# Patient Record
Sex: Female | Born: 1975 | Race: White | Hispanic: No | Marital: Single | Smoking: Never smoker
Health system: Southern US, Community
[De-identification: ages and names within clinical notes are randomized; demographics above are authoritative.]

## PROBLEM LIST (undated history)

## (undated) DIAGNOSIS — R55 Syncope and collapse: Secondary | ICD-10-CM

## (undated) DIAGNOSIS — E7800 Pure hypercholesterolemia, unspecified: Secondary | ICD-10-CM

## (undated) DIAGNOSIS — R4681 Obsessive-compulsive behavior: Secondary | ICD-10-CM

## (undated) HISTORY — PX: FOOT SURGERY: SHX648

## (undated) HISTORY — PX: HAND SURGERY: SHX662

## (undated) HISTORY — PX: MOUTH SURGERY: SHX715

---

## 2007-04-12 ENCOUNTER — Ambulatory Visit: Payer: Self-pay | Admitting: Emergency Medicine

## 2008-04-23 ENCOUNTER — Ambulatory Visit: Payer: Self-pay | Admitting: Internal Medicine

## 2009-05-14 ENCOUNTER — Ambulatory Visit: Payer: Self-pay | Admitting: Internal Medicine

## 2012-05-02 ENCOUNTER — Ambulatory Visit: Payer: Self-pay | Admitting: Emergency Medicine

## 2012-05-05 LAB — BETA STREP CULTURE(ARMC)

## 2012-11-09 ENCOUNTER — Ambulatory Visit: Payer: Self-pay | Admitting: Physician Assistant

## 2013-05-26 ENCOUNTER — Ambulatory Visit: Payer: Self-pay | Admitting: Family Medicine

## 2013-06-09 ENCOUNTER — Ambulatory Visit: Payer: Self-pay | Admitting: Family Medicine

## 2013-06-18 ENCOUNTER — Ambulatory Visit: Payer: Self-pay | Admitting: Podiatry

## 2014-01-31 ENCOUNTER — Ambulatory Visit: Payer: Self-pay | Admitting: Physician Assistant

## 2014-01-31 LAB — RAPID INFLUENZA A&B ANTIGENS (ARMC ONLY)

## 2014-01-31 LAB — RAPID STREP-A WITH REFLX: Micro Text Report: NEGATIVE

## 2014-02-03 LAB — BETA STREP CULTURE(ARMC)

## 2014-09-26 ENCOUNTER — Ambulatory Visit
Admission: EM | Admit: 2014-09-26 | Discharge: 2014-09-26 | Disposition: A | Discharge status: Home / Self Care | Payer: Medicare Other | Attending: Family Medicine | Admitting: Family Medicine

## 2014-09-26 DIAGNOSIS — Z809 Family history of malignant neoplasm, unspecified: Secondary | ICD-10-CM | POA: Insufficient documentation

## 2014-09-26 DIAGNOSIS — Z888 Allergy status to other drugs, medicaments and biological substances status: Secondary | ICD-10-CM | POA: Insufficient documentation

## 2014-09-26 DIAGNOSIS — J029 Acute pharyngitis, unspecified: Secondary | ICD-10-CM | POA: Diagnosis present

## 2014-09-26 DIAGNOSIS — E780 Pure hypercholesterolemia: Secondary | ICD-10-CM | POA: Insufficient documentation

## 2014-09-26 DIAGNOSIS — Z882 Allergy status to sulfonamides status: Secondary | ICD-10-CM | POA: Diagnosis not present

## 2014-09-26 DIAGNOSIS — Z833 Family history of diabetes mellitus: Secondary | ICD-10-CM | POA: Insufficient documentation

## 2014-09-26 DIAGNOSIS — J069 Acute upper respiratory infection, unspecified: Secondary | ICD-10-CM | POA: Insufficient documentation

## 2014-09-26 DIAGNOSIS — Z8489 Family history of other specified conditions: Secondary | ICD-10-CM | POA: Insufficient documentation

## 2014-09-26 DIAGNOSIS — Z881 Allergy status to other antibiotic agents status: Secondary | ICD-10-CM | POA: Diagnosis not present

## 2014-09-26 DIAGNOSIS — Z8249 Family history of ischemic heart disease and other diseases of the circulatory system: Secondary | ICD-10-CM | POA: Diagnosis not present

## 2014-09-26 DIAGNOSIS — R5383 Other fatigue: Secondary | ICD-10-CM | POA: Diagnosis present

## 2014-09-26 HISTORY — DX: Obsessive-compulsive behavior: R46.81

## 2014-09-26 HISTORY — DX: Pure hypercholesterolemia, unspecified: E78.00

## 2014-09-26 HISTORY — DX: Syncope and collapse: R55

## 2014-09-26 LAB — URINALYSIS COMPLETE WITH MICROSCOPIC (ARMC ONLY)
BILIRUBIN URINE: NEGATIVE
Glucose, UA: NEGATIVE mg/dL
Hgb urine dipstick: NEGATIVE
KETONES UR: NEGATIVE mg/dL
Leukocytes, UA: NEGATIVE
Nitrite: NEGATIVE
Protein, ur: NEGATIVE mg/dL
SPECIFIC GRAVITY, URINE: 1.02 (ref 1.005–1.030)
pH: 5.5 (ref 5.0–8.0)

## 2014-09-26 NOTE — ED Provider Notes (Signed)
CSN: [id number]     Arrival date & time 09/26/14  1521 History   First MD Initiated Contact with Patient 09/26/14 1610     Chief Complaint  Patient presents with  . URI   (Consider location/radiation/quality/duration/timing/severity/associated sxs/prior Treatment) Patient is a 39 y.o. female presenting with URI.  URI Presenting symptoms: congestion, ear pain, fatigue, rhinorrhea and sore throat   Severity:  Mild Onset quality:  Sudden Timing:  Constant Progression:  Unchanged Chronicity:  New Relieved by:  None tried Worsened by:  Nothing tried Associated symptoms: no arthralgias, no headaches, no myalgias, no neck pain, no sinus pain, no sneezing, no swollen glands and no wheezing     Past Medical History  Diagnosis Date  . Syncope   . Obsessive behaviors   . Hypercholesteremia    Past Surgical History  Procedure Laterality Date  . Hand surgery    . Foot surgery     Family History  Problem Relation Age of Onset  . Diabetes Mother   . Hyperlipidemia Mother   . Cancer Mother   . Diabetes Father   . Hyperlipidemia Father   . Hypertension Brother    History  Substance Use Topics  . Smoking status: Never Smoker   . Smokeless tobacco: Not on file  . Alcohol Use: No   OB History    No data available     Review of Systems  Constitutional: Positive for fatigue.  HENT: Positive for congestion, ear pain, rhinorrhea and sore throat. Negative for sneezing.   Respiratory: Negative for wheezing.   Musculoskeletal: Negative for myalgias, arthralgias and neck pain.  Neurological: Negative for headaches.    Allergies  Niacin and related; Amoxicillin; and Sulfa antibiotics  Home Medications   Prior to Admission medications   Medication Sig Start Date End Date Taking? Authorizing Provider  Multiple Vitamin (MULTIVITAMIN) tablet Take 1 tablet by mouth daily.   Yes Historical Provider, MD   BP 120/54 mmHg  Pulse 73  Temp(Src) 97.6 F (36.4 C) (Tympanic)  Resp 100   Ht 5\' 8"  (1.727 m)  Wt 267 lb (121.11 kg)  BMI 40.61 kg/m2  SpO2 100%  LMP 09/20/2014 (Approximate) Physical Exam  Constitutional: She appears well-developed and well-nourished. No distress.  HENT:  Head: Normocephalic.  Right Ear: Tympanic membrane, external ear and ear canal normal.  Left Ear: Tympanic membrane, external ear and ear canal normal.  Nose: Rhinorrhea present.  Mouth/Throat: Mucous membranes are normal. Posterior oropharyngeal erythema present. No oropharyngeal exudate, posterior oropharyngeal edema or tonsillar abscesses.  Eyes: Conjunctivae and EOM are normal. Pupils are equal, round, and reactive to light. Right eye exhibits no discharge. Left eye exhibits no discharge. No scleral icterus.  Neck: Normal range of motion. Neck supple. No JVD present. No tracheal deviation present. No thyromegaly present.  Cardiovascular: Normal rate, regular rhythm, normal heart sounds and intact distal pulses.   No murmur heard. Pulmonary/Chest: Effort normal and breath sounds normal. No stridor. No respiratory distress. She has no wheezes. She has no rales. She exhibits no tenderness.  Abdominal: Bowel sounds are normal.  Lymphadenopathy:    She has no cervical adenopathy.  Neurological: She is alert.  Skin: No rash noted. She is not diaphoretic.  Vitals reviewed.   ED Course  Procedures (including critical care time) Labs Review Labs Reviewed  URINALYSIS COMPLETEWITH MICROSCOPIC [facility] ONLY) - Abnormal; Notable for the following:    Squamous Epithelial / LPF 0-5 (*)    All other components within normal limits  Imaging Review No results found.   MDM   1. Viral URI    Plan: 1. diagnosis reviewed with patient 2. Recommend supportive treatment with otc analgesics, "cold" medications, rest, increased fluids 3. F/u prn if symptoms worsen or don't improve    Payton Mccallum, MD 09/26/14 1655

## 2014-09-26 NOTE — ED Notes (Signed)
Started yesterday with sore throat and "not feeling well". Temperature 99.9 at work today. Denies any urinary symptoms. Also c/o right ear pain

## 2015-03-28 ENCOUNTER — Ambulatory Visit
Admission: EM | Admit: 2015-03-28 | Discharge: 2015-03-28 | Disposition: A | Discharge status: Home / Self Care | Payer: Medicare Other | Attending: Family Medicine | Admitting: Family Medicine

## 2015-03-28 ENCOUNTER — Encounter: Payer: Self-pay | Admitting: Emergency Medicine

## 2015-03-28 DIAGNOSIS — J069 Acute upper respiratory infection, unspecified: Secondary | ICD-10-CM | POA: Insufficient documentation

## 2015-03-28 DIAGNOSIS — R05 Cough: Secondary | ICD-10-CM | POA: Insufficient documentation

## 2015-03-28 LAB — RAPID STREP SCREEN (MED CTR MEBANE ONLY): Streptococcus, Group A Screen (Direct): NEGATIVE

## 2015-03-28 MED ORDER — FLUTICASONE PROPIONATE 50 MCG/ACT NA SUSP: 2.0000 | Freq: Every day | NASAL | Status: DC

## 2015-03-28 MED ORDER — HYDROCOD POLST-CPM POLST ER 10-8 MG/5ML PO SUER: 5.0000 mL | Freq: Two times a day (BID) | ORAL | Status: DC

## 2015-03-28 NOTE — ED Provider Notes (Signed)
CSN: [id number]     Arrival date & time 03/28/15  1851 History   First MD Initiated Contact with Patient 03/28/15 1916     Chief Complaint  Patient presents with  . 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 Her fevers have been running as high as 99.5. The cough is productive at first of green tinged sputum but has since lessened. Coughing which is interfering with her sleep. 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 Today she is afebrile pulse 88 respirations 16 blood pressure 112/60 and an O2 sat on room air 100%. She does cough frequently during our interview. She also mentions that her throat has been very sore started her symptoms continues to bother her.  Past Medical History  Diagnosis Date  . Syncope   . Obsessive behaviors   . Hypercholesteremia    Past Surgical History  Procedure Laterality Date  . Hand surgery    . Foot surgery     Family History  Problem Relation Age of Onset  . Diabetes Mother   . Hyperlipidemia Mother   . Cancer Mother   . Diabetes Father   . Hyperlipidemia Father   . Hypertension Brother    Social History  Substance Use Topics  . Smoking status: Never Smoker   . Smokeless tobacco: None  . Alcohol Use: No   OB History    Gravida Para Term Preterm AB TAB SAB Ectopic Multiple Living   0 0 0 0 0 0 0 0 0 0      Review of Systems  Constitutional: Positive for fever, chills and activity change. Negative for fatigue.  HENT: Positive for congestion, postnasal drip and sore throat.   Respiratory: Positive for cough. Negative for wheezing and stridor.   All other systems reviewed and are negative.   Allergies  Niacin and related; Amoxicillin; and Sulfa antibiotics  Home Medications   Prior  to Admission medications   Medication Sig Start Date End Date Taking? Authorizing Provider  Black Cohosh 540 MG CAPS Take 1 capsule by mouth daily.   Yes Historical Provider, MD  Evening Primrose topical oil Apply 1 application topically as needed for dry skin.   Yes Historical Provider, MD  chlorpheniramine-HYDROcodone (TUSSIONEX PENNKINETIC ER) 10-8 MG/5ML SUER Take 5 mLs by mouth 2 (two) times daily. 03/28/15   Lutricia FeilWilliam P Roemer, PA-C  fluticasone (FLONASE) 50 MCG/ACT nasal spray Place 2 sprays into both nostrils daily. 03/28/15   Lutricia FeilWilliam P Roemer, PA-C  Multiple Vitamin (MULTIVITAMIN) tablet Take 1 tablet by mouth daily.    Historical Provider, MD   Meds Ordered and Administered this Visit  Medications - No data to display  BP 112/60 mmHg  Pulse 88  Temp(Src) 97.1 F (36.2 C) (Tympanic)  Resp 16  Ht 5\' 8"  (1.727 m)  Wt 260 lb (117.935 kg)  BMI 39.54 kg/m2  SpO2 100%  LMP 03/07/2015 (Approximate) No data found.   Physical Exam  Constitutional: She is oriented to person, place, and time. She appears well-developed and well-nourished. No distress.  HENT:  Head: Normocephalic and atraumatic.  Right Ear: External ear normal.  Left Ear: External ear normal.  Nose: Nose normal.  Mouth/Throat: Oropharynx is clear and moist. No oropharyngeal exudate.  Eyes: Conjunctivae are normal. Pupils are equal, round, and reactive to light.  Neck: Normal range of motion. Neck supple. No thyromegaly present.  Pulmonary/Chest: Effort normal and breath sounds normal. No respiratory distress. She has no wheezes. She has no rales.  Musculoskeletal: Normal range of motion. She exhibits no edema or tenderness.  Lymphadenopathy:    She has no cervical adenopathy.  Neurological: She is alert and oriented to person, place, and time.  Skin: Skin is warm and dry. She is not diaphoretic.  Psychiatric: She has a normal mood and affect. Her behavior is normal. Judgment and thought content normal.  Nursing  note and vitals reviewed.   ED Course  Procedures (including critical care time)  Labs Review Labs Reviewed  RAPID STREP SCREEN (NOT AT [facility])  CULTURE, GROUP A STREP ([facility] ONLY)    Imaging Review No results found.   Visual Acuity Review  Right Eye Distance:   Left Eye Distance:   Bilateral Distance:    Right Eye Near:   Left Eye Near:    Bilateral Near:         MDM   1. Acute URI    Discharge Medication List as of 03/28/2015  8:25 PM    START taking these medications   Details  chlorpheniramine-HYDROcodone (TUSSIONEX PENNKINETIC ER) 10-8 MG/5ML SUER Take 5 mLs by mouth 2 (two) times daily., Starting 03/28/2015, Until Discontinued, Print    fluticasone (FLONASE) 50 MCG/ACT nasal spray Place 2 sprays into both nostrils daily., Starting 03/28/2015, Until Discontinued, Print      Plan: 1. Test/x-ray results and diagnosis reviewed with patient 2. rx as per orders; risks, benefits, potential side effects reviewed with patient 3. Recommend supportive treatment with flonase rest and increased fluids. Keep her out of work today and tomorrow or return on the next day. Discussed with her the URI at this stage does not require antibiotics. 4. F/u with her primary care physician in 1-2 weeks if not improved     Lutricia Feil, PA-C 03/28/15 2028

## 2015-03-28 NOTE — Discharge Instructions (Signed)
Cool Mist Vaporizers °Vaporizers may help relieve the symptoms of a cough and cold. They add moisture to the air, which helps mucus to become thinner and less sticky. This makes it easier to breathe and cough up secretions. Cool mist vaporizers do not cause serious burns like hot mist vaporizers, which may also be called steamers or humidifiers. Vaporizers have not been proven to help with colds. You should not use a vaporizer if you are allergic to mold. °HOME CARE INSTRUCTIONS °· Follow the package instructions for the vaporizer. °· Do not use anything other than distilled water in the vaporizer. °· Do not run the vaporizer all of the time. This can cause mold or bacteria to grow in the vaporizer. °· Clean the vaporizer after each time it is used. °· Clean and dry the vaporizer well before storing it. °· Stop using the vaporizer if worsening respiratory symptoms develop. °  °This information is not intended to replace advice given to you by your health care provider. Make sure you discuss any questions you have with your health care provider. °  °Document Released: 12/21/2003 Document Revised: 03/30/2013 Document Reviewed: 08/12/2012 °Elsevier Interactive Patient Education ©2016 [employer] ° °Upper Respiratory Infection, Adult °Most upper respiratory infections (URIs) are a viral infection of the air passages leading to the lungs. A URI affects the nose, throat, and upper air passages. The most common type of URI is nasopharyngitis and is typically referred to as "the common cold." °URIs run their course and usually go away on their own. Most of the time, a URI does not require medical attention, but sometimes a bacterial infection in the upper airways can follow a viral infection. This is called a secondary infection. Sinus and middle ear infections are common types of secondary upper respiratory infections. °Bacterial pneumonia can also complicate a URI. A URI can worsen asthma and chronic obstructive  pulmonary disease (COPD). Sometimes, these complications can require emergency medical care and may be life threatening.  °CAUSES °Almost all URIs are caused by viruses. A virus is a type of germ and can spread from one person to another.  °RISKS FACTORS °You may be at risk for a URI if:  °· You smoke.   °· You have chronic heart or lung disease. °· You have a weakened defense (immune) system.   °· You are very young or very old.   °· You have nasal allergies or asthma. °· You work in crowded or poorly ventilated areas. °· You work in health care facilities or schools. °SIGNS AND SYMPTOMS  °Symptoms typically develop 2-3 days after you come in contact with a cold virus. Most viral URIs last 7-10 days. However, viral URIs from the influenza virus (flu virus) can last 14-18 days and are typically more severe. Symptoms may include:  °· Runny or stuffy (congested) nose.   °· Sneezing.   °· Cough.   °· Sore throat.   °· Headache.   °· Fatigue.   °· Fever.   °· Loss of appetite.   °· Pain in your forehead, behind your eyes, and over your cheekbones (sinus pain). °· Muscle aches.   °DIAGNOSIS  °Your health care provider may diagnose a URI by: °· Physical exam. °· Tests to check that your symptoms are not due to another condition such as: °¨ Strep throat. °¨ Sinusitis. °¨ Pneumonia. °¨ Asthma. °TREATMENT  °A URI goes away on its own with time. It cannot be cured with medicines, but medicines may be prescribed or recommended to relieve symptoms. Medicines may help: °· Reduce your fever. °· Reduce   your cough. °· Relieve nasal congestion. °HOME CARE INSTRUCTIONS  °· Take medicines only as directed by your health care provider.   °· Gargle warm saltwater or take cough drops to comfort your throat as directed by your health care provider. °· Use a warm mist humidifier or inhale steam from a shower to increase air moisture. This may make it easier to breathe. °· Drink enough fluid to keep your urine clear or pale yellow.   °· Eat  soups and other clear broths and maintain good nutrition.   °· Rest as needed.   °· Return to work when your temperature has returned to normal or as your health care provider advises. You may need to stay home longer to avoid infecting others. You can also use a face mask and careful hand washing to prevent spread of the virus. °· Increase the usage of your inhaler if you have asthma.   °· Do not use any tobacco products, including cigarettes, chewing tobacco, or electronic cigarettes. If you need help quitting, ask your health care provider. °PREVENTION  °The best way to protect yourself from getting a cold is to practice good hygiene.  °· Avoid oral or hand contact with people with cold symptoms.   °· Wash your hands often if contact occurs.   °There is no clear evidence that vitamin C, vitamin E, echinacea, or exercise reduces the chance of developing a cold. However, it is always recommended to get plenty of rest, exercise, and practice good nutrition.  °SEEK MEDICAL CARE IF:  °· You are getting worse rather than better.   °· Your symptoms are not controlled by medicine.   °· You have chills. °· You have worsening shortness of breath. °· You have brown or red mucus. °· You have yellow or brown nasal discharge. °· You have pain in your face, especially when you bend forward. °· You have a fever. °· You have swollen neck glands. °· You have pain while swallowing. °· You have white areas in the back of your throat. °SEEK IMMEDIATE MEDICAL CARE IF:  °· You have severe or persistent: °¨ Headache. °¨ Ear pain. °¨ Sinus pain. °¨ Chest pain. °· You have chronic lung disease and any of the following: °¨ Wheezing. °¨ Prolonged cough. °¨ Coughing up blood. °¨ A change in your usual mucus. °· You have a stiff neck. °· You have changes in your: °¨ Vision. °¨ Hearing. °¨ Thinking. °¨ Mood. °MAKE SURE YOU:  °· Understand these instructions. °· Will watch your condition. °· Will get help right away if you are not doing well or  get worse. °  °This information is not intended to replace advice given to you by your health care provider. Make sure you discuss any questions you have with your health care provider. °  °Document Released: 09/18/2000 Document Revised: 08/09/2014 Document Reviewed: 06/30/2013 °Elsevier Interactive Patient Education ©2016 Elsevier Inc. ° °

## 2015-03-28 NOTE — ED Notes (Signed)
Patient c/o cough and chest congestion since Saturday.   

## 2015-03-31 LAB — CULTURE, GROUP A STREP (THRC)
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
## 2017-01-16 ENCOUNTER — Ambulatory Visit
Admission: EM | Admit: 2017-01-16 | Discharge: 2017-01-16 | Disposition: A | Discharge status: Home / Self Care | Payer: BLUE CROSS/BLUE SHIELD | Attending: Family Medicine | Admitting: Family Medicine

## 2017-01-16 ENCOUNTER — Encounter: Payer: Self-pay | Admitting: Emergency Medicine

## 2017-01-16 DIAGNOSIS — M546 Pain in thoracic spine: Secondary | ICD-10-CM

## 2017-01-16 MED ORDER — NAPROXEN 500 MG PO TABS: 500.0000 mg | ORAL_TABLET | Freq: Two times a day (BID) | ORAL | 0 refills | Status: DC | PRN

## 2017-01-16 MED ORDER — CYCLOBENZAPRINE HCL 10 MG PO TABS
10.0000 mg | ORAL_TABLET | Freq: Three times a day (TID) | ORAL | 0 refills | Status: DC | PRN
Start: 2017-01-16 — End: 2017-05-28
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
## 2017-01-16 NOTE — Discharge Instructions (Signed)
Medications as directed.  Rest.  Take care  Dr. Adriana Simas

## 2017-05-28 ENCOUNTER — Ambulatory Visit
Admission: EM | Admit: 2017-05-28 | Discharge: 2017-05-28 | Disposition: A | Discharge status: Home / Self Care | Payer: BLUE CROSS/BLUE SHIELD | Attending: Emergency Medicine | Admitting: Emergency Medicine

## 2017-05-28 ENCOUNTER — Encounter: Payer: Self-pay | Admitting: *Deleted

## 2017-05-28 ENCOUNTER — Other Ambulatory Visit: Payer: Self-pay

## 2017-05-28 DIAGNOSIS — L240 Irritant contact dermatitis due to detergents: Secondary | ICD-10-CM | POA: Diagnosis not present

## 2017-05-28 DIAGNOSIS — R21 Rash and other nonspecific skin eruption: Secondary | ICD-10-CM | POA: Diagnosis not present

## 2017-05-28 DIAGNOSIS — J22 Unspecified acute lower respiratory infection: Secondary | ICD-10-CM | POA: Diagnosis not present

## 2017-05-28 MED ORDER — TRIAMCINOLONE ACETONIDE 0.1 % EX CREA: 1.0000 "application " | TOPICAL_CREAM | Freq: Two times a day (BID) | CUTANEOUS | 0 refills | Status: AC

## 2017-05-28 MED ORDER — BENZONATATE 200 MG PO CAPS: 200.0000 mg | ORAL_CAPSULE | Freq: Three times a day (TID) | ORAL | 0 refills | Status: DC | PRN

## 2017-05-28 MED ORDER — FLUTICASONE PROPIONATE 50 MCG/ACT NA SUSP: 2.0000 | Freq: Every day | NASAL | 0 refills | Status: DC

## 2017-05-28 MED ORDER — HYDROCOD POLST-CPM POLST ER 10-8 MG/5ML PO SUER: 5.0000 mL | Freq: Two times a day (BID) | ORAL | 0 refills | Status: DC | PRN

## 2017-05-28 MED ORDER — DOXYCYCLINE HYCLATE 100 MG PO CAPS: 100.0000 mg | ORAL_CAPSULE | Freq: Two times a day (BID) | ORAL | 0 refills | Status: AC

## 2017-05-28 NOTE — Discharge Instructions (Signed)
Flonase, Mucinex D 1200/120 mg twice a day saline nasal irrigation, Tessalon, Tussionex.  Will send home with a wait-and-see prescription of doxycycline if you are not better with these medicines in 3 days

## 2017-05-28 NOTE — ED Provider Notes (Signed)
HPI  SUBJECTIVE:  Brenda York is a 42 y.o. female who presents with 2 complaints.  First she reports a greenish nasal congestion, rhinorrhea, postnasal drip, chest congestion,  cough productive in the morning of the same material as her nasal congestion, states that she is coughing all day long. This has been Going on for a week.  Reports mild sore throat from the cough.  Reports body aches, fatigue.  No fevers, wheezing, chest pain, shortness of breath, dyspnea on exertion, double sickening.  States that she is sleeping okay at night.  No antibiotics in the past month.  No antipyretic in the past 6-8 hours.  She tried Elderberry, oscillinuium and NyQuil at night with improvement in her symptoms.  Symptoms are worse with working.  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  No allergy symptoms.   Second, she reports several areas of flaky, dry, erythematous flat rash over her torso for the past 1-2 weeks that started after she started using a new detergent.   She has stopped using this detergent and she states that the rash appears to be getting better. It is not in a dermatomal distribution. She states it is itchy, it is not painful.  No other new lotions, soaps.  She has tried Vaseline lotion which has helped with the dryness, no aggravating factors.  Past medical history negative for asthma, eczema, COPD, smoking, diabetes, hypertension, allergies.  LMP: 1-2 weeks ago.  Denies the possibility being pregnant.  [id number], [facility]     Past Medical History:  Diagnosis Date  . Hypercholesteremia   . Obsessive behaviors   . Syncope     Past Surgical History:  Procedure Laterality Date  . FOOT SURGERY    . HAND SURGERY      Family History  Problem Relation Age of Onset  . Diabetes Mother   . Hyperlipidemia Mother   . Cancer Mother   . Diabetes Father   . Hyperlipidemia Father   . Hypertension Brother     Social History   Tobacco Use  .  Smoking status: Never Smoker  . Smokeless tobacco: Never Used  Substance Use Topics  . Alcohol use: No  . Drug use: No    No current facility-administered medications for this encounter.   Current Outpatient Medications:  .  Black Cohosh 540 MG CAPS, Take 1 capsule by mouth daily., Disp: , Rfl:  .  Evening Primrose topical oil, Apply 1 application topically as needed for dry skin., Disp: , Rfl:  .  Multiple Vitamin (MULTIVITAMIN) tablet, Take 1 tablet by mouth daily., Disp: , Rfl:  .  S-Adenosylmethionine (SAME PO), Take by mouth daily., Disp: , Rfl:  .  benzonatate (TESSALON) 200 MG capsule, Take 1 capsule (200 mg total) by mouth 3 (three) times daily as needed for cough., Disp: 30 capsule, Rfl: 0 .  chlorpheniramine-HYDROcodone (TUSSIONEX PENNKINETIC ER) 10-8 MG/5ML SUER, Take 5 mLs by mouth every 12 (twelve) hours as needed for cough., Disp: 120 mL, Rfl: 0 .  doxycycline (VIBRAMYCIN) 100 MG capsule, Take 1 capsule (100 mg total) by mouth 2 (two) times daily for 7 days., Disp: 14 capsule, Rfl: 0 .  fluticasone (FLONASE) 50 MCG/ACT nasal spray, Place 2 sprays into both nostrils daily., Disp: 16 g, Rfl: 0 .  triamcinolone cream (KENALOG) 0.1 %, Apply 1 application topically 2 (two) times daily. Apply for 2 weeks. May use on face, Disp: 30 g, Rfl: 0  Allergies  Allergen  Reactions  . Niacin And Related Other (See Comments)    Syncope  . Amoxicillin Rash  . Sulfa Antibiotics Rash     ROS  As noted in HPI.   Physical Exam  BP (!) 101/47 (BP Location: Left Arm)   Pulse 67   Temp 98.3 F (36.8 C) (Oral)   Resp 16   Ht 5\' 8"  (1.727 m)   Wt 250 lb (113.4 kg)   LMP 05/14/2017   SpO2 100%   BMI 38.01 kg/m   Constitutional: Well developed, well nourished, no acute distress Eyes:  EOMI, conjunctiva normal bilaterally HENT: Normocephalic, atraumatic,mucus membranes moist.  Clear nasal congestion, erythematous, swollen turbinates, no sinus tenderness.  Positive postnasal drip with  cobblestoning.  Tonsils normal, uvula midline. Respiratory: Normal inspiratory effort, lungs clear bilaterally.  Good air movement. Cardiovascular: Normal rate, regular rhythm, no murmurs, rubs, gallops. GI: nondistended skin: Flat nontender area of erythema underneath the bra strap consistent with contact dermatitis.  See picture.   Musculoskeletal: no deformities Neurologic: Alert & oriented x 3, no focal neuro deficits Psychiatric: Speech and behavior appropriate   ED Course   Medications - No data to display  No orders of the defined types were placed in this encounter.   No results found for this or any previous visit (from the past 24 hour(s)). No results found.  ED Clinical Impression  LRTI (lower respiratory tract infection)  Irritant contact dermatitis due to detergent   ED Assessment/Plan  [place] Narcotic database reviewed for this patient, and feel that the risk/benefit ratio today is favorable for proceeding with a prescription for controlled substance.  No opiate prescriptions in 2 years.  1. Presentation consistent with URI, now transitioning to a lower respiratory tract infection.  She does not describe any symptoms of bronchospasm and her lungs are clear, so withholding albuterol.  Will treat supportively with Flonase, Mucinex D, saline nasal irrigation, Tessalon, Tussionex.  Will send home with a wait-and-see prescription of doxycycline if she is not better with these medicines in 3 days  2.  Rash: Appears to be a contact dermatitis from a new detergent.  Advised patient to switch back to her old detergent. we will send home with triamcinolone.  Follow Up with PMD as needed. Discussed MDM, plan and followup with patient.  patient agrees with plan.   Meds ordered this encounter  Medications  . fluticasone (FLONASE) 50 MCG/ACT nasal spray    Sig: Place 2 sprays into both nostrils daily.    Dispense:  16 g    Refill:  0  . chlorpheniramine-HYDROcodone  (TUSSIONEX PENNKINETIC ER) 10-8 MG/5ML SUER    Sig: Take 5 mLs by mouth every 12 (twelve) hours as needed for cough.    Dispense:  120 mL    Refill:  0  . triamcinolone cream (KENALOG) 0.1 %    Sig: Apply 1 application topically 2 (two) times daily. Apply for 2 weeks. May use on face    Dispense:  30 g    Refill:  0  . benzonatate (TESSALON) 200 MG capsule    Sig: Take 1 capsule (200 mg total) by mouth 3 (three) times daily as needed for cough.    Dispense:  30 capsule    Refill:  0  . doxycycline (VIBRAMYCIN) 100 MG capsule    Sig: Take 1 capsule (100 mg total) by mouth 2 (two) times daily for 7 days.    Dispense:  14 capsule    Refill:  0    *  This clinic note was created using Scientist, clinical (histocompatibility and immunogenetics). Therefore, there may be occasional mistakes despite careful proofreading.   ?   Domenick Gong, MD 05/28/17 1323

## 2017-05-28 NOTE — ED Triage Notes (Signed)
Patient started having symptoms of nasal congestion cough, and aches 1 week ago.

## 2017-09-10 ENCOUNTER — Ambulatory Visit
Admission: EM | Admit: 2017-09-10 | Discharge: 2017-09-10 | Disposition: A | Discharge status: Home / Self Care | Payer: BLUE CROSS/BLUE SHIELD | Attending: Family Medicine | Admitting: Family Medicine

## 2017-09-10 ENCOUNTER — Other Ambulatory Visit: Payer: Self-pay

## 2017-09-10 ENCOUNTER — Encounter: Payer: Self-pay | Admitting: Emergency Medicine

## 2017-09-10 DIAGNOSIS — R5383 Other fatigue: Secondary | ICD-10-CM

## 2017-09-10 DIAGNOSIS — F32A Depression, unspecified: Secondary | ICD-10-CM

## 2017-09-10 DIAGNOSIS — F329 Major depressive disorder, single episode, unspecified: Secondary | ICD-10-CM

## 2017-09-10 LAB — CBC WITH DIFFERENTIAL/PLATELET
BASOS PCT: 1 %
Basophils Absolute: 0 10*3/uL (ref 0–0.1)
EOS ABS: 0 10*3/uL (ref 0–0.7)
EOS PCT: 0 %
HCT: 43.7 % (ref 35.0–47.0)
HEMOGLOBIN: 14.8 g/dL (ref 12.0–16.0)
Lymphocytes Relative: 28 %
Lymphs Abs: 1.6 10*3/uL (ref 1.0–3.6)
MCH: 32.3 pg (ref 26.0–34.0)
MCHC: 33.8 g/dL (ref 32.0–36.0)
MCV: 95.5 fL (ref 80.0–100.0)
Monocytes Absolute: 0.5 10*3/uL (ref 0.2–0.9)
Monocytes Relative: 8 %
NEUTROS PCT: 63 %
Neutro Abs: 3.5 10*3/uL (ref 1.4–6.5)
PLATELETS: 180 10*3/uL (ref 150–440)
RBC: 4.58 MIL/uL (ref 3.80–5.20)
RDW: 12.4 % (ref 11.5–14.5)
WBC: 5.6 10*3/uL (ref 3.6–11.0)

## 2017-09-10 LAB — BASIC METABOLIC PANEL
Anion gap: 8 (ref 5–15)
BUN: 11 mg/dL (ref 6–20)
CALCIUM: 8.5 mg/dL — AB (ref 8.9–10.3)
CO2: 24 mmol/L (ref 22–32)
CREATININE: 0.67 mg/dL (ref 0.44–1.00)
Chloride: 106 mmol/L (ref 101–111)
Glucose, Bld: 93 mg/dL (ref 65–99)
Potassium: 4 mmol/L (ref 3.5–5.1)
SODIUM: 138 mmol/L (ref 135–145)

## 2017-09-10 MED ORDER — BUPROPION HCL ER (XL) 150 MG PO TB24: 150.0000 mg | ORAL_TABLET | Freq: Every day | ORAL | 0 refills | Status: AC

## 2017-09-10 NOTE — ED Provider Notes (Signed)
[facility]    CSN: [id number] Arrival date & time: 09/10/17  1636     History   Chief Complaint Chief Complaint  Patient presents with  . Fatigue  . Depression    HPI Brenda York is a 42 y.o. female.   42 yo female with a c/o fatigue and depression for 4-6 months. Denies suicidal or homicidal ideation. Has not seen PCP for this.   The history is provided by the patient.    Past Medical History:  Diagnosis Date  . Hypercholesteremia   . Obsessive behaviors   . Syncope     There are no active problems to display for this patient.   Past Surgical History:  Procedure Laterality Date  . FOOT SURGERY    . HAND SURGERY    . MOUTH SURGERY      OB History    Gravida  0   Para  0   Term  0   Preterm  0   AB  0   Living  0     SAB  0   TAB  0   Ectopic  0   Multiple  0   Live Births               Home Medications    Prior to Admission medications   Medication Sig Start Date End Date Taking? Authorizing Provider  Black Cohosh 540 MG CAPS Take 1 capsule by mouth daily.   Yes [provider]  Evening Primrose topical oil Apply 1 application topically as needed for dry skin.   Yes [provider]  Multiple Vitamin (MULTIVITAMIN) tablet Take 1 tablet by mouth daily.   Yes [provider]  S-Adenosylmethionine (SAME PO) Take by mouth daily.   Yes [provider]  benzonatate (TESSALON) 200 MG capsule Take 1 capsule (200 mg total) by mouth 3 (three) times daily as needed for cough. 05/28/17   Domenick GongMortenson, Ashley, MD  buPROPion (WELLBUTRIN XL) 150 MG 24 hr tablet Take 1 tablet (150 mg total) by mouth daily. 09/10/17   Payton Mccallumonty, Kiegan Macaraeg, MD  chlorpheniramine-HYDROcodone (TUSSIONEX PENNKINETIC ER) 10-8 MG/5ML SUER Take 5 mLs by mouth every 12 (twelve) hours as needed for cough. 05/28/17   Domenick GongMortenson, Ashley, MD  fluticasone (FLONASE) 50 MCG/ACT nasal spray Place 2 sprays into both nostrils daily. 05/28/17    Domenick GongMortenson, Ashley, MD  triamcinolone cream (KENALOG) 0.1 % Apply 1 application topically 2 (two) times daily. Apply for 2 weeks. May use on face 05/28/17   Domenick GongMortenson, Ashley, MD    Family History Family History  Problem Relation Age of Onset  . Diabetes Mother   . Hyperlipidemia Mother   . Cancer Mother   . Diabetes Father   . Hyperlipidemia Father   . Hypertension Brother     Social History Social History   Tobacco Use  . Smoking status: Never Smoker  . Smokeless tobacco: Never Used  Substance Use Topics  . Alcohol use: No  . Drug use: No     Allergies   Niacin and related; Amoxicillin; and Sulfa antibiotics   Review of Systems Review of Systems   Physical Exam Triage Vital Signs ED Triage Vitals  Enc Vitals Group     BP 09/10/17 1649 (!) 110/54     Pulse Rate 09/10/17 1649 68     Resp 09/10/17 1649 18     Temp 09/10/17 1649 98.8 F (37.1 C)     Temp Source 09/10/17 1649 Oral  SpO2 09/10/17 1649 99 %     Weight 09/10/17 1646 255 lb (115.7 kg)     Height 09/10/17 1646 5\' 8"  (1.727 m)     Head Circumference --      Peak Flow --      Pain Score 09/10/17 1645 0     Pain Loc --      Pain Edu? --      Excl. in GC? --    No data found.  Updated Vital Signs BP (!) 110/54 (BP Location: Left Arm)   Pulse 68   Temp 98.8 F (37.1 C) (Oral)   Resp 18   Ht 5\' 8"  (1.727 m)   Wt 255 lb (115.7 kg)   LMP 08/27/2017 (Approximate)   SpO2 99%   BMI 38.77 kg/m   Visual Acuity Right Eye Distance:   Left Eye Distance:   Bilateral Distance:    Right Eye Near:   Left Eye Near:    Bilateral Near:     Physical Exam  Constitutional: She appears well-developed and well-nourished. No distress.  Neck: No thyromegaly present.  Cardiovascular: Normal rate, regular rhythm and normal heart sounds.  Pulmonary/Chest: Effort normal and breath sounds normal. No stridor. No respiratory distress. She has no wheezes. She has no rales.  Skin: She is not diaphoretic.    Nursing note and vitals reviewed.    UC Treatments / Results  Labs (all labs ordered are listed, but only abnormal results are displayed) Labs Reviewed  BASIC METABOLIC PANEL - Abnormal; Notable for the following components:      Result Value   Calcium 8.5 (*)    All other components within normal limits  CBC WITH DIFFERENTIAL/PLATELET    EKG None  Radiology No results found.  Procedures Procedures (including critical care time)  Medications Ordered in UC Medications - No data to display  Initial Impression / Assessment and Plan / UC Course  I have reviewed the triage vital signs and the nursing notes.  Pertinent labs & imaging results that were available during my care of the patient were reviewed by me and considered in my medical decision making (see chart for details).      Final Clinical Impressions(s) / UC Diagnoses   Final diagnoses:  Depression, unspecified depression type   Discharge Instructions   None    ED Prescriptions    Medication Sig Dispense Auth. Provider   buPROPion (WELLBUTRIN XL) 150 MG 24 hr tablet Take 1 tablet (150 mg total) by mouth daily. 15 tablet Payton Mccallum, MD     1. Lab results and diagnosis reviewed with patient 2. rx as per orders above; reviewed possible side effects, interactions, risks and benefits  3. Follow-up with PCP for continuing management of chronic condition   Controlled Substance Prescriptions [place] Controlled Substance Registry consulted? Not Applicable   Payton Mccallum, MD 09/10/17 [phone]

## 2017-09-10 NOTE — Discharge Instructions (Signed)
Follow up with Primary Care Provider °

## 2017-09-10 NOTE — ED Triage Notes (Signed)
Patient reports fatigue and depression that started 1-2 months ago. Moved out of parents house in January, recent break up with significant other.

## 2018-01-21 ENCOUNTER — Ambulatory Visit
Admission: EM | Admit: 2018-01-21 | Discharge: 2018-01-21 | Disposition: A | Discharge status: Home / Self Care | Payer: BLUE CROSS/BLUE SHIELD | Attending: Family Medicine | Admitting: Family Medicine

## 2018-01-21 ENCOUNTER — Other Ambulatory Visit: Payer: Self-pay

## 2018-01-21 DIAGNOSIS — R05 Cough: Secondary | ICD-10-CM | POA: Diagnosis not present

## 2018-01-21 DIAGNOSIS — J029 Acute pharyngitis, unspecified: Secondary | ICD-10-CM | POA: Diagnosis not present

## 2018-01-21 DIAGNOSIS — J069 Acute upper respiratory infection, unspecified: Secondary | ICD-10-CM

## 2018-01-21 DIAGNOSIS — B9789 Other viral agents as the cause of diseases classified elsewhere: Secondary | ICD-10-CM

## 2018-01-21 DIAGNOSIS — R0981 Nasal congestion: Secondary | ICD-10-CM | POA: Diagnosis not present

## 2018-01-21 LAB — RAPID STREP SCREEN (MED CTR MEBANE ONLY): Streptococcus, Group A Screen (Direct): NEGATIVE

## 2018-01-21 LAB — RAPID INFLUENZA A&B ANTIGENS (ARMC ONLY)
INFLUENZA A (ARMC): NEGATIVE
INFLUENZA B (ARMC): NEGATIVE

## 2018-01-21 NOTE — ED Provider Notes (Signed)
[facility]    CSN: [id number] Arrival date & time: 01/21/18  0844     History   Chief Complaint Chief Complaint  Patient presents with  . Sore Throat    HPI Brenda York is a 42 y.o. female.   The history is provided by the patient.  Sore Throat   URI  Presenting symptoms: congestion, cough, fever and sore throat   Severity:  Moderate Onset quality:  Sudden Duration:  4 days Timing:  Constant Progression:  Unchanged Chronicity:  New Relieved by:  Nothing Ineffective treatments:  OTC medications Associated symptoms: no wheezing   Risk factors: sick contacts   Risk factors: not elderly, no chronic cardiac disease, no chronic kidney disease, no chronic respiratory disease, no diabetes mellitus, no immunosuppression, no recent illness and no recent travel     Past Medical History:  Diagnosis Date  . Hypercholesteremia   . Obsessive behaviors   . Syncope     There are no active problems to display for this patient.   Past Surgical History:  Procedure Laterality Date  . FOOT SURGERY    . HAND SURGERY    . MOUTH SURGERY      OB History    Gravida  0   Para  0   Term  0   Preterm  0   AB  0   Living  0     SAB  0   TAB  0   Ectopic  0   Multiple  0   Live Births               Home Medications    Prior to Admission medications   Medication Sig Start Date End Date Taking? Authorizing Provider  Black Cohosh 540 MG CAPS Take 1 capsule by mouth daily.   Yes [provider]  buPROPion (WELLBUTRIN XL) 150 MG 24 hr tablet Take 1 tablet (150 mg total) by mouth daily. 09/10/17  Yes Payton Mccallum, MD  Evening Primrose topical oil Apply 1 application topically as needed for dry skin.   Yes [provider]  Multiple Vitamin (MULTIVITAMIN) tablet Take 1 tablet by mouth daily.   Yes [provider]  S-Adenosylmethionine (SAME PO) Take by mouth daily.   Yes [provider]  spironolactone  (ALDACTONE) 50 MG tablet Take by mouth. 09/25/17 09/25/18 Yes [provider]  benzonatate (TESSALON) 200 MG capsule Take 1 capsule (200 mg total) by mouth 3 (three) times daily as needed for cough. 05/28/17   Domenick Gong, MD  chlorpheniramine-HYDROcodone [facility] PENNKINETIC ER) 10-8 MG/5ML SUER Take 5 mLs by mouth every 12 (twelve) hours as needed for cough. 05/28/17   Domenick Gong, MD  fluticasone (FLONASE) 50 MCG/ACT nasal spray Place 2 sprays into both nostrils daily. 05/28/17   Domenick Gong, MD  triamcinolone cream (KENALOG) 0.1 % Apply 1 application topically 2 (two) times daily. Apply for 2 weeks. May use on face 05/28/17   Domenick Gong, MD    Family History Family History  Problem Relation Age of Onset  . Diabetes Mother   . Hyperlipidemia Mother   . Cancer Mother   . Diabetes Father   . Hyperlipidemia Father   . Hypertension Brother     Social History Social History   Tobacco Use  . Smoking status: Never Smoker  . Smokeless tobacco: Never Used  Substance Use Topics  . Alcohol use: No  . Drug use: No     Allergies   Niacin  and related; Amoxicillin; and Sulfa antibiotics   Review of Systems Review of Systems  Constitutional: Positive for fever.  HENT: Positive for congestion and sore throat.   Respiratory: Positive for cough. Negative for wheezing.      Physical Exam Triage Vital Signs ED Triage Vitals  Enc Vitals Group     BP 01/21/18 0902 111/66     Pulse Rate 01/21/18 0902 82     Resp 01/21/18 0902 18     Temp 01/21/18 0902 98.5 F (36.9 C)     Temp Source 01/21/18 0902 Oral     SpO2 01/21/18 0902 99 %     Weight 01/21/18 0858 240 lb (108.9 kg)     Height 01/21/18 0858 5\' 8"  (1.727 m)     Head Circumference --      Peak Flow --      Pain Score 01/21/18 0858 4     Pain Loc --      Pain Edu? --      Excl. in GC? --    No data found.  Updated Vital Signs BP 111/66 (BP Location: Right Arm)   Pulse 82   Temp 98.5 F  (36.9 C) (Oral)   Resp 18   Ht 5\' 8"  (1.727 m)   Wt 108.9 kg   LMP 12/31/2017   SpO2 99%   BMI 36.49 kg/m   Visual Acuity Right Eye Distance:   Left Eye Distance:   Bilateral Distance:    Right Eye Near:   Left Eye Near:    Bilateral Near:     Physical Exam  Constitutional: She appears well-developed and well-nourished. No distress.  HENT:  Head: Normocephalic and atraumatic.  Right Ear: Tympanic membrane, external ear and ear canal normal.  Left Ear: Tympanic membrane, external ear and ear canal normal.  Nose: Mucosal edema and rhinorrhea present. No nose lacerations, sinus tenderness, nasal deformity, septal deviation or nasal septal hematoma. No epistaxis.  No foreign bodies. Right sinus exhibits no maxillary sinus tenderness and no frontal sinus tenderness. Left sinus exhibits no maxillary sinus tenderness and no frontal sinus tenderness.  Mouth/Throat: Uvula is midline, oropharynx is clear and moist and mucous membranes are normal. No oropharyngeal exudate.  Neck: Normal range of motion. Neck supple. No thyromegaly present.  Cardiovascular: Normal rate, regular rhythm and normal heart sounds.  Pulmonary/Chest: Effort normal and breath sounds normal. No stridor. No respiratory distress. She has no wheezes. She has no rales.  Lymphadenopathy:    She has no cervical adenopathy.  Skin: She is not diaphoretic.  Nursing note and vitals reviewed.    UC Treatments / Results  Labs (all labs ordered are listed, but only abnormal results are displayed) Labs Reviewed  RAPID STREP SCREEN ([facility] ONLY)  RAPID INFLUENZA A&B ANTIGENS ([facility] ONLY)  CULTURE, GROUP A STREP [facility])    EKG None  Radiology No results found.  Procedures Procedures (including critical care time)  Medications Ordered in UC Medications - No data to display  Initial Impression / Assessment and Plan / UC Course  I have reviewed the triage vital signs and the nursing notes.  Pertinent labs  & imaging results that were available during my care of the patient were reviewed by me and considered in my medical decision making (see chart for details).      Final Clinical Impressions(s) / UC Diagnoses   Final diagnoses:  Viral URI with cough    ED Prescriptions    None  1. Lab results and diagnosis reviewed with patient 2. Recommend supportive treatment with rest, fluids, otc meds prn 3. Follow-up prn if symptoms worsen or don't improve    Controlled Substance Prescriptions [place] Controlled Substance Registry consulted? Not Applicable   Payton Mccallum, MD 01/21/18 1003

## 2018-01-21 NOTE — ED Triage Notes (Signed)
Patient complains of sore throat, low grade fever, and cough since Sunday.

## 2018-01-23 LAB — CULTURE, GROUP A STREP (THRC)

## 2019-10-17 ENCOUNTER — Encounter: Payer: Self-pay | Admitting: Emergency Medicine

## 2019-10-17 ENCOUNTER — Other Ambulatory Visit: Payer: Self-pay

## 2019-10-17 ENCOUNTER — Ambulatory Visit
Admission: EM | Admit: 2019-10-17 | Discharge: 2019-10-17 | Disposition: A | Discharge status: Home / Self Care | Payer: 59 | Attending: Internal Medicine | Admitting: Internal Medicine

## 2019-10-17 DIAGNOSIS — R0981 Nasal congestion: Secondary | ICD-10-CM | POA: Diagnosis not present

## 2019-10-17 DIAGNOSIS — Z20822 Contact with and (suspected) exposure to covid-19: Secondary | ICD-10-CM | POA: Diagnosis not present

## 2019-10-17 DIAGNOSIS — Z880 Allergy status to penicillin: Secondary | ICD-10-CM | POA: Insufficient documentation

## 2019-10-17 DIAGNOSIS — R05 Cough: Secondary | ICD-10-CM | POA: Diagnosis present

## 2019-10-17 DIAGNOSIS — R519 Headache, unspecified: Secondary | ICD-10-CM | POA: Diagnosis not present

## 2019-10-17 DIAGNOSIS — Z79899 Other long term (current) drug therapy: Secondary | ICD-10-CM | POA: Insufficient documentation

## 2019-10-17 DIAGNOSIS — Z882 Allergy status to sulfonamides status: Secondary | ICD-10-CM | POA: Insufficient documentation

## 2019-10-17 DIAGNOSIS — J029 Acute pharyngitis, unspecified: Secondary | ICD-10-CM | POA: Diagnosis not present

## 2019-10-17 MED ORDER — FLUTICASONE PROPIONATE 50 MCG/ACT NA SUSP: 2.0000 | Freq: Every day | NASAL | 0 refills | Status: AC

## 2019-10-17 MED ORDER — BENZONATATE 100 MG PO CAPS: 100.0000 mg | ORAL_CAPSULE | Freq: Three times a day (TID) | ORAL | 0 refills | Status: AC | PRN

## 2019-10-17 NOTE — ED Triage Notes (Signed)
Patient c/o cough, congestion, runny nose, and headaches that started Friday evening.  Patient denies fevers.

## 2019-10-17 NOTE — ED Provider Notes (Signed)
[facility]    CSN: [id number] Arrival date & time: 10/17/19  2122      History   Chief Complaint Chief Complaint  Patient presents with  . Cough  . Nasal Congestion    HPI Brenda York is a 44 y.o. female comes to urgent care with complaints of nasal congestion, runny nose, nonproductive cough and headaches which started Friday.  Patient denies any fever or chills.  Patient works in the hospital on the orthopedic floor and had exposure to somebody without respiratory infection symptoms a few days prior to onset of her symptoms.  She is fully vaccinated against COVID-19 virus.  She denies any shortness of breath or wheezing.  She has some right ear pressure with no ear discharge or pain.  No nausea, vomiting or diarrhea.   HPI  Past Medical History:  Diagnosis Date  . Hypercholesteremia   . Obsessive behaviors   . Syncope     There are no problems to display for this patient.   Past Surgical History:  Procedure Laterality Date  . FOOT SURGERY    . HAND SURGERY    . MOUTH SURGERY      OB History    Gravida  0   Para  0   Term  0   Preterm  0   AB  0   Living  0     SAB  0   TAB  0   Ectopic  0   Multiple  0   Live Births               Home Medications    Prior to Admission medications   Medication Sig Start Date End Date Taking? Authorizing Provider  buPROPion (WELLBUTRIN XL) 150 MG 24 hr tablet Take 1 tablet (150 mg total) by mouth daily. 09/10/17  Yes Payton Mccallum, MD  Evening Primrose topical oil Apply 1 application topically as needed for dry skin.   Yes [provider]  Multiple Vitamin (MULTIVITAMIN) tablet Take 1 tablet by mouth daily.   Yes [provider]  S-Adenosylmethionine (SAME PO) Take by mouth daily.   Yes [provider]  spironolactone (ALDACTONE) 50 MG tablet Take by mouth. 09/25/17 10/17/19 Yes [provider]  benzonatate (TESSALON) 100 MG capsule Take 1 capsule (100  mg total) by mouth 3 (three) times daily as needed for cough. 10/17/19   Merrilee Jansky, MD  fluticasone (FLONASE) 50 MCG/ACT nasal spray Place 2 sprays into both nostrils daily. 10/17/19   Koriana Stepien, Britta Mccreedy, MD  triamcinolone cream (KENALOG) 0.1 % Apply 1 application topically 2 (two) times daily. Apply for 2 weeks. May use on face 05/28/17   Domenick Gong, MD    Family History Family History  Problem Relation Age of Onset  . Diabetes Mother   . Hyperlipidemia Mother   . Cancer Mother   . Diabetes Father   . Hyperlipidemia Father   . Hypertension Brother     Social History Social History   Tobacco Use  . Smoking status: Never Smoker  . Smokeless tobacco: Never Used  Vaping Use  . Vaping Use: Never used  Substance Use Topics  . Alcohol use: No  . Drug use: No     Allergies   Niacin and related, Amoxicillin, and Sulfa antibiotics   Review of Systems Review of Systems  Constitutional: Positive for activity change and fatigue.  HENT: Positive for congestion, postnasal drip, rhinorrhea and sore throat. Negative for mouth sores  and nosebleeds.   Respiratory: Negative.  Negative for cough, shortness of breath and wheezing.   Gastrointestinal: Negative.   Genitourinary: Negative.   Skin: Negative.      Physical Exam Triage Vital Signs ED Triage Vitals  Enc Vitals Group     BP 10/17/19 0926 (!) 111/49     Pulse Rate 10/17/19 0926 91     Resp 10/17/19 0926 14     Temp 10/17/19 0926 98.9 F (37.2 C)     Temp Source 10/17/19 0926 Oral     SpO2 10/17/19 0926 98 %     Weight 10/17/19 0921 234 lb (106.1 kg)     Height 10/17/19 0921 5\' 7"  (1.702 m)     Head Circumference --      Peak Flow --      Pain Score 10/17/19 0921 2     Pain Loc --      Pain Edu? --      Excl. in GC? --    No data found.  Updated Vital Signs BP (!) 111/49 (BP Location: Right Arm)   Pulse 91   Temp 98.9 F (37.2 C) (Oral)   Resp 14   Ht 5\' 7"  (1.702 m)   Wt 106.1 kg   LMP  10/10/2019 (Approximate)   SpO2 98%   BMI 36.65 kg/m   Visual Acuity Right Eye Distance:   Left Eye Distance:   Bilateral Distance:    Right Eye Near:   Left Eye Near:    Bilateral Near:     Physical Exam Vitals and nursing note reviewed.  Constitutional:      General: She is not in acute distress.    Appearance: She is not ill-appearing.  HENT:     Right Ear: Tympanic membrane normal.     Left Ear: Tympanic membrane normal.     Ears:     Comments: Right middle ear effusion    Nose: Congestion present.     Mouth/Throat:     Mouth: Mucous membranes are moist.  Cardiovascular:     Rate and Rhythm: Normal rate and regular rhythm.     Pulses: Normal pulses.     Heart sounds: Normal heart sounds.  Pulmonary:     Effort: Pulmonary effort is normal.     Breath sounds: Normal breath sounds.  Abdominal:     General: Bowel sounds are normal.     Palpations: Abdomen is soft.  Musculoskeletal:        General: Normal range of motion.  Neurological:     Mental Status: She is alert.      UC Treatments / Results  Labs (all labs ordered are listed, but only abnormal results are displayed) Labs Reviewed  SARS CORONAVIRUS 2 (TAT 6-24 HRS)    EKG   Radiology No results found.  Procedures Procedures (including critical care time)  Medications Ordered in UC Medications - No data to display  Initial Impression / Assessment and Plan / UC Course  I have reviewed the triage vital signs and the nursing notes.  Pertinent labs & imaging results that were available during my care of the patient were reviewed by me and considered in my medical decision making (see chart for details).     1.  Acute pharyngitis: Fluticasone nasal spray Tessalon Perles as needed for coughing COVID-19 testing Work note given If symptoms worsen or if you develop worsening shortness of breath, patient is advised to return to urgent care to be reevaluated.  Final  Clinical Impressions(s) / UC  Diagnoses   Final diagnoses:  Acute pharyngitis, unspecified etiology   Discharge Instructions   None    ED Prescriptions    Medication Sig Dispense Auth. Provider   fluticasone (FLONASE) 50 MCG/ACT nasal spray Place 2 sprays into both nostrils daily. 16 g Ivey Nembhard, Britta Mccreedy, MD   benzonatate (TESSALON) 100 MG capsule Take 1 capsule (100 mg total) by mouth 3 (three) times daily as needed for cough. 30 capsule Raneisha Bress, Britta Mccreedy, MD     PDMP not reviewed this encounter.   Merrilee Jansky, MD 10/17/19 [phone]

## 2019-10-18 LAB — SARS CORONAVIRUS 2 (TAT 6-24 HRS): SARS Coronavirus 2: NEGATIVE

## 2023-12-05 ENCOUNTER — Ambulatory Visit
Admission: EM | Admit: 2023-12-05 | Discharge: 2023-12-05 | Disposition: A | Discharge status: Home / Self Care | Attending: Physician Assistant | Admitting: Physician Assistant

## 2023-12-05 DIAGNOSIS — R5383 Other fatigue: Secondary | ICD-10-CM | POA: Diagnosis present

## 2023-12-05 DIAGNOSIS — Z9189 Other specified personal risk factors, not elsewhere classified: Secondary | ICD-10-CM | POA: Diagnosis present

## 2023-12-05 DIAGNOSIS — Z20822 Contact with and (suspected) exposure to covid-19: Secondary | ICD-10-CM | POA: Diagnosis not present

## 2023-12-05 LAB — SARS CORONAVIRUS 2 BY RT PCR: SARS Coronavirus 2 by RT PCR: NEGATIVE

## 2023-12-05 NOTE — ED Triage Notes (Signed)
 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

## 2023-12-05 NOTE — Discharge Instructions (Signed)
Negative COVID test

## 2023-12-05 NOTE — ED Provider Notes (Signed)
 [facility]    CSN: [id number] Arrival date & time: 12/05/23  1736      History   Chief Complaint Chief Complaint  Patient presents with   Covid Exposure    HPI Brenda York is a 48 y.o. female presenting for fatigue and not feeling well for the past few days.  Patient reports having a slight cough a couple days ago that has resolved.  No fever, sore throat, congestion, chest pain, shortness of breath, abdominal pain, nausea/vomiting or diarrhea.  Reports COVID exposure.  Has taken COVID tests but they have been negative.  She would like to go back to work and will need a note to return.  States she is feeling better than she was a few days ago.  HPI  Past Medical History:  Diagnosis Date   Hypercholesteremia    Obsessive behaviors    Syncope     There are no active problems to display for this patient.   Past Surgical History:  Procedure Laterality Date   FOOT SURGERY     HAND SURGERY     MOUTH SURGERY      OB History     Gravida  0   Para  0   Term  0   Preterm  0   AB  0   Living  0      SAB  0   IAB  0   Ectopic  0   Multiple  0   Live Births               Home Medications    Prior to Admission medications   Medication Sig Start Date End Date Taking? Authorizing Provider  buPROPion  (WELLBUTRIN  XL) 150 MG 24 hr tablet Take 1 tablet (150 mg total) by mouth daily. 09/10/17  Yes Servando Hire, MD  Evening Primrose topical oil Apply 1 application topically as needed for dry skin.   Yes [provider]  Multiple Vitamin (MULTIVITAMIN) tablet Take 1 tablet by mouth daily.   Yes [provider]  sertraline (ZOLOFT) 50 MG tablet Take 50 mg by mouth daily.   Yes [provider]  benzonatate  (TESSALON ) 100 MG capsule Take 1 capsule (100 mg total) by mouth 3 (three) times daily as needed for cough. 10/17/19   Blaise Aleene KIDD, MD  fluticasone  (FLONASE ) 50 MCG/ACT nasal spray Place 2 sprays into both  nostrils daily. 10/17/19   LampteyAleene KIDD, MD  S-Adenosylmethionine (SAME PO) Take by mouth daily.    [provider]  spironolactone (ALDACTONE) 50 MG tablet Take by mouth. 09/25/17 10/17/19  [provider]  triamcinolone  cream (KENALOG ) 0.1 % Apply 1 application topically 2 (two) times daily. Apply for 2 weeks. May use on face 05/28/17   Van Knee, MD    Family History Family History  Problem Relation Age of Onset   Diabetes Mother    Hyperlipidemia Mother    Cancer Mother    Diabetes Father    Hyperlipidemia Father    Hypertension Brother     Social History Social History   Tobacco Use   Smoking status: Never   Smokeless tobacco: Never  Vaping Use   Vaping status: Never Used  Substance Use Topics   Alcohol use: No   Drug use: No     Allergies   Niacin and related, Amoxicillin, and Sulfa antibiotics   Review of Systems Review of Systems  Constitutional:  Positive for fatigue. Negative for chills, diaphoresis and fever.  HENT:  Negative for congestion, ear pain, rhinorrhea, sinus pressure, sinus pain and sore throat.   Respiratory:  Negative for cough and shortness of breath.   Cardiovascular:  Negative for chest pain.  Gastrointestinal:  Negative for abdominal pain, nausea and vomiting.  Musculoskeletal:  Negative for arthralgias and myalgias.  Skin:  Negative for rash.  Neurological:  Negative for weakness and headaches.  Hematological:  Negative for adenopathy.     Physical Exam Triage Vital Signs ED Triage Vitals [12/05/23 1753]  Encounter Vitals Group     BP      Girls Systolic BP Percentile      Girls Diastolic BP Percentile      Boys Systolic BP Percentile      Boys Diastolic BP Percentile      Pulse      Resp      Temp      Temp src      SpO2      Weight      Height      Head Circumference      Peak Flow      Pain Score 0     Pain Loc      Pain Education      Exclude from Growth Chart    No data  found.  Updated Vital Signs BP 124/82 (BP Location: Left Arm)   Pulse 69   Temp 98.7 F (37.1 C) (Oral)   LMP 11/28/2023      Physical Exam Vitals and nursing note reviewed.  Constitutional:      General: She is not in acute distress.    Appearance: Normal appearance. She is not ill-appearing or toxic-appearing.  HENT:     Head: Normocephalic and atraumatic.     Nose: Nose normal.     Mouth/Throat:     Mouth: Mucous membranes are moist.     Pharynx: Oropharynx is clear.  Eyes:     General: No scleral icterus.       Right eye: No discharge.        Left eye: No discharge.     Conjunctiva/sclera: Conjunctivae normal.  Cardiovascular:     Rate and Rhythm: Normal rate and regular rhythm.     Heart sounds: Normal heart sounds.  Pulmonary:     Effort: Pulmonary effort is normal. No respiratory distress.     Breath sounds: Normal breath sounds.  Musculoskeletal:     Cervical back: Neck supple.  Skin:    General: Skin is dry.  Neurological:     General: No focal deficit present.     Mental Status: She is alert. Mental status is at baseline.     Motor: No weakness.     Gait: Gait normal.  Psychiatric:        Mood and Affect: Mood normal.        Behavior: Behavior normal.      UC Treatments / Results  Labs (all labs ordered are listed, but only abnormal results are displayed) Labs Reviewed  SARS CORONAVIRUS 2 BY RT PCR    EKG   Radiology No results found.  Procedures Procedures (including critical care time)  Medications Ordered in UC Medications - No data to display  Initial Impression / Assessment and Plan / UC Course  I have reviewed the triage vital signs and the nursing notes.  Pertinent labs & imaging results that were available during my care of the patient were reviewed by me and considered in my medical decision  making (see chart for details).   48 year old female presents for fatigue for the last few days.  Had a cough but it resolved.  No  associated fevers.  Feeling better from onset.  Negative COVID test at home but given recent exposure would like to be retested and will need a work note to return to work.  PCR COVID test negative today.  Reviewed result patient.  Exam is benign.  Reviewed supportive care.  Return to work note given.  Follow-up as needed.   Final Clinical Impressions(s) / UC Diagnoses   Final diagnoses:  Other fatigue  At increased risk of exposure to COVID-19 virus     Discharge Instructions      -Negative COVID test.     ED Prescriptions   None    PDMP not reviewed this encounter.   Arvis Jolan NOVAK, PA-C 12/05/23 [phone]
